# Patient Record
Sex: Male | Born: 2000 | Race: White | Hispanic: No | Marital: Single | Smoking: Current every day smoker
Health system: Southern US, Community
[De-identification: ages and names within clinical notes are randomized; demographics above are authoritative.]

## PROBLEM LIST (undated history)

## (undated) DIAGNOSIS — F32A Depression, unspecified: Secondary | ICD-10-CM

## (undated) DIAGNOSIS — F900 Attention-deficit hyperactivity disorder, predominantly inattentive type: Secondary | ICD-10-CM

## (undated) DIAGNOSIS — R5383 Other fatigue: Secondary | ICD-10-CM

## (undated) DIAGNOSIS — F419 Anxiety disorder, unspecified: Secondary | ICD-10-CM

## (undated) DIAGNOSIS — F329 Major depressive disorder, single episode, unspecified: Secondary | ICD-10-CM

## (undated) DIAGNOSIS — F429 Obsessive-compulsive disorder, unspecified: Secondary | ICD-10-CM

## (undated) HISTORY — DX: Obsessive-compulsive disorder, unspecified: F42.9

## (undated) HISTORY — DX: Attention-deficit hyperactivity disorder, predominantly inattentive type: F90.0

## (undated) HISTORY — DX: Anxiety disorder, unspecified: F41.9

## (undated) HISTORY — DX: Other fatigue: R53.83

## (undated) HISTORY — DX: Major depressive disorder, single episode, unspecified: F32.9

## (undated) HISTORY — DX: Depression, unspecified: F32.A

---
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
## 2021-07-26 ENCOUNTER — Inpatient Hospital Stay
Admit: 2021-07-26 | Discharge: 2021-07-26 | Disposition: A | Discharge status: Home / Self Care | Payer: BLUE CROSS/BLUE SHIELD | Attending: Emergency Medicine

## 2021-07-26 ENCOUNTER — Emergency Department: Admit: 2021-07-26 | Payer: BLUE CROSS/BLUE SHIELD

## 2021-07-26 DIAGNOSIS — R0782 Intercostal pain: Secondary | ICD-10-CM

## 2021-07-26 LAB — CBC WITH AUTO DIFFERENTIAL
Absolute Baso #: 0 10*3/uL (ref 0.0–0.2)
Absolute Eos #: 0.3 10*3/uL (ref 0.0–0.5)
Absolute Lymph #: 2 10*3/uL (ref 1.0–3.2)
Absolute Mono #: 0.4 10*3/uL (ref 0.3–1.0)
Basophils %: 0.8 % (ref 0.0–2.0)
Eosinophils %: 6.8 % (ref 0.0–7.0)
Hematocrit: 42.4 % (ref 38.0–52.0)
Hemoglobin: 14.4 g/dL (ref 13.0–17.3)
Immature Grans (Abs): 0.01 10*3/uL (ref 0.00–0.06)
Immature Granulocytes: 0.2 % (ref 0.0–0.6)
Lymphocytes: 40 % (ref 15.0–45.0)
MCH: 28.9 pg (ref 27.0–34.5)
MCHC: 34 g/dL (ref 32.0–36.0)
MCV: 85 fL (ref 84.0–100.0)
MPV: 11.1 fL (ref 7.2–13.2)
Monocytes: 8.2 % (ref 4.0–12.0)
Neutrophils %: 44 % (ref 42.0–74.0)
Neutrophils Absolute: 2.2 10*3/uL (ref 1.6–7.3)
Platelets: 214 10*3/uL (ref 140–440)
RBC: 4.99 x10e6/mcL (ref 4.00–5.60)
RDW: 12.1 % (ref 11.0–16.0)
WBC: 5 10*3/uL (ref 3.8–10.6)

## 2021-07-26 LAB — COMPREHENSIVE METABOLIC PANEL
ALT: 16 U/L (ref 0–50)
AST: 23 U/L (ref 0–50)
Albumin/Globulin Ratio: 2 (ref 1.00–2.70)
Albumin: 4.6 g/dL (ref 3.5–5.2)
Alk Phosphatase: 76 U/L (ref 40–130)
Anion Gap: 9 mmol/L (ref 2–17)
BUN: 14 mg/dL (ref 6–20)
CO2: 27 mmol/L (ref 22–29)
Calcium: 9.6 mg/dL (ref 8.6–10.0)
Chloride: 106 mmol/L (ref 98–107)
Creatinine: 1.2 mg/dL (ref 0.7–1.3)
Est, Glom Filt Rate: 89 mL/min/1.73m (ref >=60)
Globulin: 2.1 g/dL (ref 1.9–4.4)
Glucose: 85 mg/dL (ref 70–99)
OSMOLALITY CALCULATED: 283 mOsm/kg (ref 270–287)
Potassium: 4.2 mmol/L (ref 3.5–5.3)
Sodium: 142 mmol/L (ref 135–145)
Total Bilirubin: 0.32 mg/dL (ref 0.00–1.20)
Total Protein: 6.7 g/dL (ref 6.4–8.3)

## 2021-07-26 LAB — ADD ON LAB TEST

## 2021-07-26 LAB — TROPONIN: Troponin T: <0.01 ng/mL (ref 0.000–0.010)

## 2021-07-26 LAB — D-DIMER, QUANTITATIVE: D-Dimer, Quant: 0.36 CD:295178345 (ref 0.19–0.50)

## 2021-07-26 LAB — MAGNESIUM: Magnesium: 2 mg/dL (ref 1.6–2.6)

## 2021-07-26 MED ORDER — KETOROLAC TROMETHAMINE 15 MG/ML IJ SOLN
15 MG/ML | Freq: Once | INTRAMUSCULAR | Status: DC
Start: 2021-07-26 — End: 2021-07-26

## 2021-07-26 MED FILL — KETOROLAC TROMETHAMINE 15 MG/ML IJ SOLN: 15 MG/ML | INTRAMUSCULAR | Qty: 1

## 2021-07-26 NOTE — ED Provider Notes (Signed)
[facility] EMERGENCY DEPT  EMERGENCY DEPARTMENT ENCOUNTER      Pt Name: Joshua Ortega  MRN: [id number]  Birthdate [birth year]  Date of evaluation: 07/26/2021  Provider: Thayer Dallas, DO    CHIEF COMPLAINT       Chief Complaint   Patient presents with    Chest Pain     Left sided chest pain that woke patient from sleep around 0430. Describes as "twisting" sensation. Denies any other symptoms. Pain lessened but has not completely resolved. No Medications prior to arrival. Denies cardiac history         HISTORY OF PRESENT ILLNESS    Is a 22 year old male presenting to the emergency department as a left-sided chest pain he felt a tightening twisting sensation left side of his chest this morning.  Denies any associated shortness of breath nausea dizziness lightheadedness syncope near syncope.  Patient states yesterday he was feeling well and normal.  Denies any recent trauma illness or sick contacts, denies any calf pain or swelling.  He did not take anything for the symptoms.  Denies any history or family history of early heart disease or sudden cardiac death.  He does workout he lifts weights he does use preworkout    The history is provided by the patient.     Nursing Notes were reviewed.    REVIEW OF SYSTEMS       Review of Systems   All other systems reviewed and are negative.    Except as noted above the remainder of the review of systems was reviewed and negative.       PAST MEDICAL HISTORY   History reviewed. No pertinent past medical history.    SURGICAL HISTORY     History reviewed. No pertinent surgical history.    CURRENT MEDICATIONS       There are no discharge medications for this patient.      ALLERGIES     Patient has no known allergies.    FAMILY HISTORY     History reviewed. No pertinent family history.     SOCIAL HISTORY       Social History     Socioeconomic History    Marital status: Single     Spouse name: None    Number of children: None    Years of education: None    Highest education level: None    Tobacco Use    Smoking status: Every Day     Packs/day: 1.00     Types: Cigarettes    Smokeless tobacco: Former   Haematologist Use: Every day    Substances: Nicotine    Devices: Disposable   Substance and Sexual Activity    Alcohol use: Not Currently    Drug use: Not Currently       SCREENINGS         Glasgow Coma Scale  Eye Opening: Spontaneous  Best Verbal Response: Oriented  Best Motor Response: Obeys commands  Glasgow Coma Scale Score: 15                     CIWA Assessment  BP: 112/70  Pulse: 60                 PHYSICAL EXAM    (up to 7 for level 4, 8 or more for level 5)     ED Triage Vitals [07/26/21 0652]   BP Temp Temp Source Pulse Respirations SpO2 Height Weight - Scale   Marland Kitchen)  148/83 98.2 F (36.8 C) Oral 69 18 100 % 5\' 8"  (1.727 m) 135 lb (61.2 kg)       Physical Exam  Vitals and nursing note reviewed.   Constitutional:       Appearance: He is not toxic-appearing.   HENT:      Head: Normocephalic.      Right Ear: External ear normal.      Left Ear: External ear normal.      Nose: Nose normal.      Mouth/Throat:      Mouth: Mucous membranes are moist.   Eyes:      Pupils: Pupils are equal, round, and reactive to light.   Cardiovascular:      Rate and Rhythm: Normal rate and regular rhythm.      Pulses: Normal pulses.      Heart sounds: Normal heart sounds.   Pulmonary:      Effort: Pulmonary effort is normal.      Breath sounds: Normal breath sounds.   Abdominal:      General: Bowel sounds are normal.      Palpations: Abdomen is soft.   Musculoskeletal:         General: Normal range of motion.      Cervical back: Neck supple.   Skin:     General: Skin is warm and dry.      Capillary Refill: Capillary refill takes less than 2 seconds.   Neurological:      General: No focal deficit present.      Mental Status: He is alert and oriented to person, place, and time.   Psychiatric:         Mood and Affect: Mood normal.         Behavior: Behavior normal.       DIAGNOSTIC RESULTS     EKG: All EKG's are  interpreted by the Emergency Department Physician who either signs or Co-signs this chart in the absence of a cardiologist.    RADIOLOGY:   Non-plain film images such as CT, Ultrasound and MRI are read by the radiologist. Plain radiographic images are visualized and preliminarily interpreted by the emergency physician with the below findings:    Interpretation per the Radiologist below, if available at the time of this note:    XR CHEST (2 VW)   Final Result   Normal PA and lateral chest.            ED BEDSIDE ULTRASOUND:   Performed by ED Physician - none    LABS:  Labs Reviewed   MAGNESIUM   CBC WITH AUTO DIFFERENTIAL   COMPREHENSIVE METABOLIC PANEL   ADD ON LAB TEST   D-DIMER, QUANTITATIVE   TROPONIN   ADD ON LAB TEST       All other labs were within normal range or not returned as of this dictation.    EMERGENCY DEPARTMENT COURSE/REASSESSMENT and MDM:   Vitals:    Vitals:    07/26/21 0652 07/26/21 0830   BP: (!) 148/83 112/70   Pulse: 69 60   Resp: 18 18   Temp: 98.2 F (36.8 C)    TempSrc: Oral    SpO2: 100% 100%   Weight: 61.2 kg    Height: 5\' 8"  (1.727 m)        ED Course:    ED Course as of 07/26/21 0930   Mon Jul 26, 2021   0927 EKG as interpreted by me showing sinus rhythm normal rate normal  axis incomplete right bundle no acute ST elevation or acute ischemic changes. [MB]      ED Course User Index  [MB] Glynda Jaeger Gertie Fey, DO       MDM  Number of Diagnoses or Management Options  Diagnosis management comments: Is a pleasant 21 year old Male presented to the emergency department chest pain, reports that left-sided chest pain was mild in nature is reproducible, no red flags and no syncope near syncope hemoptysis shortness of breath he is not tachycardic he is not hypoxic.    His EKG shows incomplete right bundle, CBC chemistry troponin dimer negative.  Chest x-ray is noted.  Chest x-ray reviewed by me 2 view showing no effusion pneumothorax or infiltrate.    Patient feeling better after Toradol.    I  suspect may be related to muscular strain given that he has been lifting weights advised to avoid preworkout increase oral hydration and follow-up with primary care doctor.    I reviewed all findings with the patient and/or family at the bedside. Discussed all incidental findings, discussed plan for follow up with PCP/specialist, Advised to return if any changes, worsening, or any other concerns at any time. Pt/family aware and in agreement with the plan of treatment, all questions answered.           CONSULTS:  None    PROCEDURES:  Unless otherwise noted below, none     Procedures    FINAL IMPRESSION      1. Intercostal pain          DISPOSITION/PLAN   DISPOSITION Decision To Discharge 07/26/2021 08:51:56 AM      PATIENT REFERRED TO:  [facility]  [address]  [place] [address]  [phone]  Schedule an appointment as soon as possible for a visit in 1 day  Call for preferred location      DISCHARGE MEDICATIONS:  There are no discharge medications for this patient.    Controlled Substances Monitoring:     No flowsheet data found.    (Please note that portions of this note were completed with a voice recognition program.  Efforts were made to edit the dictations but occasionally words are mis-transcribed.)    Thayer Dallas, DO (electronically signed)  Attending Emergency Physician            Thayer Dallas, DO  07/26/21 0930

## 2021-07-28 LAB — EKG 12-LEAD
P Axis: 66 degrees
P-R Interval: 136 ms
Q-T Interval: 360 ms
QRS Duration: 96 ms
QTc Calculation (Bazett): 379 ms
R Axis: 83 degrees
T Axis: 68 degrees
Ventricular Rate: 69 {beats}/min

## 2021-07-28 LAB — ADD ON LAB TEST

## 2022-12-22 ENCOUNTER — Ambulatory Visit: Admit: 2022-12-22 | Discharge: 2022-12-22 | Payer: BLUE CROSS/BLUE SHIELD

## 2022-12-22 ENCOUNTER — Ambulatory Visit
Admit: 2022-12-22 | Discharge: 2022-12-22 | Payer: BLUE CROSS/BLUE SHIELD | Attending: Family Medicine | Admitting: Family Medicine

## 2022-12-22 VITALS — BP 110/74 | HR 76 | Temp 98.70000°F | Resp 18 | Ht 68.0 in | Wt 135.0 lb

## 2022-12-22 DIAGNOSIS — M79642 Pain in left hand: Secondary | ICD-10-CM

## 2022-12-22 DIAGNOSIS — W540XXA Bitten by dog, initial encounter: Secondary | ICD-10-CM

## 2022-12-22 NOTE — Progress Notes (Signed)
 Subjective:dog bites      Patient ID: Joshua Ortega       Animal Bite      The patient is a 22 y.o. male sp dog bites hands and multiple abrasions seen in er pending rabies vaccination. Some pain left finger if sp injury on augmentin. No fever    Review of S
